# Patient Record
Sex: Male | Born: 1970 | Race: White | Hispanic: No | Marital: Single
Health system: Southern US, Community
[De-identification: ages and names within clinical notes are randomized; demographics above are authoritative.]

---

## 2019-06-10 ENCOUNTER — Other Ambulatory Visit: Payer: Self-pay

## 2019-06-10 ENCOUNTER — Emergency Department (HOSPITAL_COMMUNITY)
Admission: EM | Admit: 2019-06-10 | Discharge: 2019-06-11 | Disposition: A | Discharge status: Home / Self Care | Payer: BC Managed Care – PPO | Attending: Emergency Medicine | Admitting: Emergency Medicine

## 2019-06-10 DIAGNOSIS — Y939 Activity, unspecified: Secondary | ICD-10-CM | POA: Diagnosis not present

## 2019-06-10 DIAGNOSIS — S161XXA Strain of muscle, fascia and tendon at neck level, initial encounter: Secondary | ICD-10-CM | POA: Diagnosis not present

## 2019-06-10 DIAGNOSIS — Y929 Unspecified place or not applicable: Secondary | ICD-10-CM | POA: Diagnosis not present

## 2019-06-10 DIAGNOSIS — R519 Headache, unspecified: Secondary | ICD-10-CM | POA: Diagnosis present

## 2019-06-10 DIAGNOSIS — Y999 Unspecified external cause status: Secondary | ICD-10-CM | POA: Insufficient documentation

## 2019-06-10 DIAGNOSIS — S0990XA Unspecified injury of head, initial encounter: Secondary | ICD-10-CM | POA: Diagnosis not present
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
## 2019-06-11 ENCOUNTER — Emergency Department (HOSPITAL_COMMUNITY): Payer: BC Managed Care – PPO

## 2019-06-11 DIAGNOSIS — S161XXA Strain of muscle, fascia and tendon at neck level, initial encounter: Secondary | ICD-10-CM | POA: Diagnosis not present

## 2019-06-11 MED ORDER — CYCLOBENZAPRINE HCL 10 MG PO TABS: 10.0000 mg | ORAL_TABLET | Freq: Two times a day (BID) | ORAL | 0 refills | Status: AC | PRN

## 2019-06-11 MED ORDER — IBUPROFEN 800 MG PO TABS: 800.0000 mg | ORAL_TABLET | Freq: Three times a day (TID) | ORAL | 0 refills | Status: AC
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
## 2020-09-24 IMAGING — CT CT HEAD W/O CM
4 series · 17 of 47 positions shown, 19 images · non-contrast
Comparison: None.

CLINICAL DATA: Headache, head trauma

EXAM:
CT HEAD WITHOUT CONTRAST
TECHNIQUE: Contiguous axial images were obtained from the base of the skull
through the vertex without intravenous contrast.

[Series 3: head wo · axial · 0.56mm/px · z∈[+1004,+1134]mm · 7 of 36 slices shown, 9 images]
[im 5/36  brain]
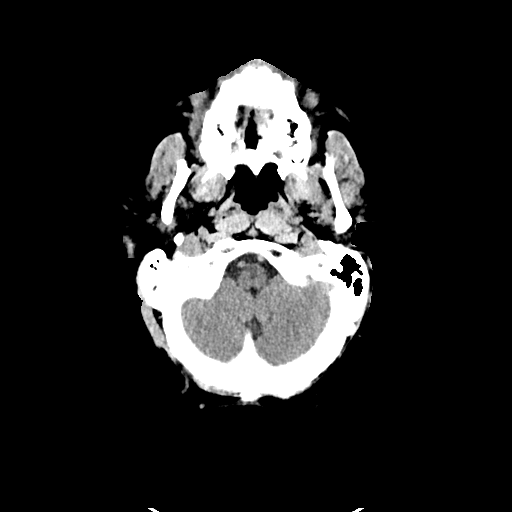
[im 5/36  bone]
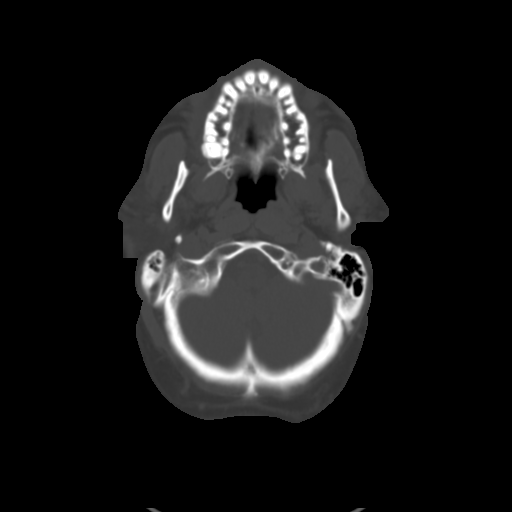
[im 9/36  brain]
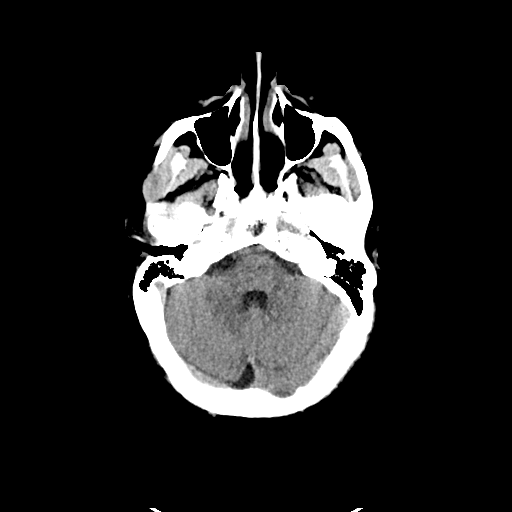
[im 14/36  brain]
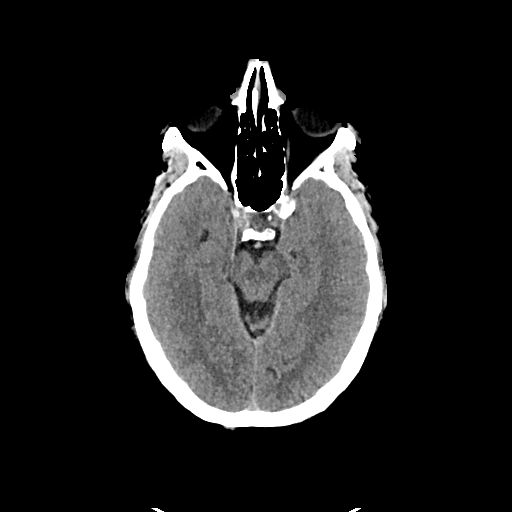
[im 18/36  brain]
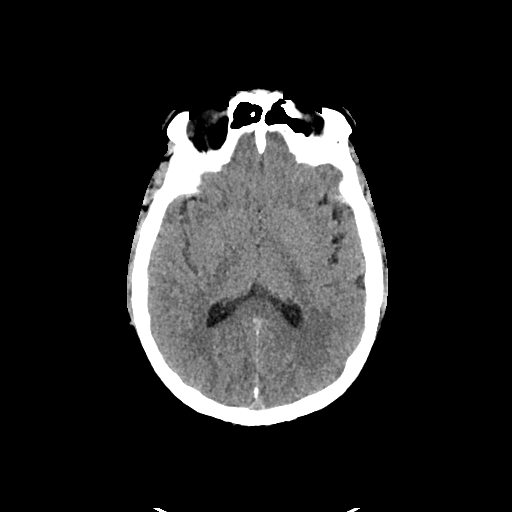
[im 22/36  brain]
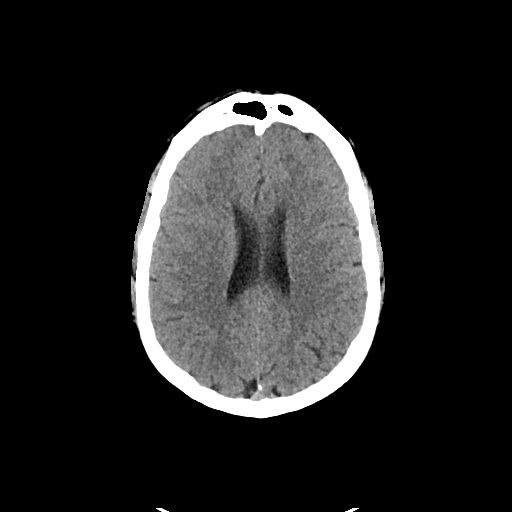
[im 22/36  bone]
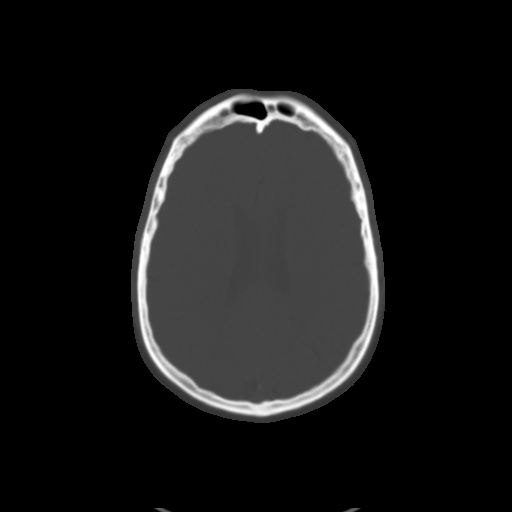
[im 27/36  brain]
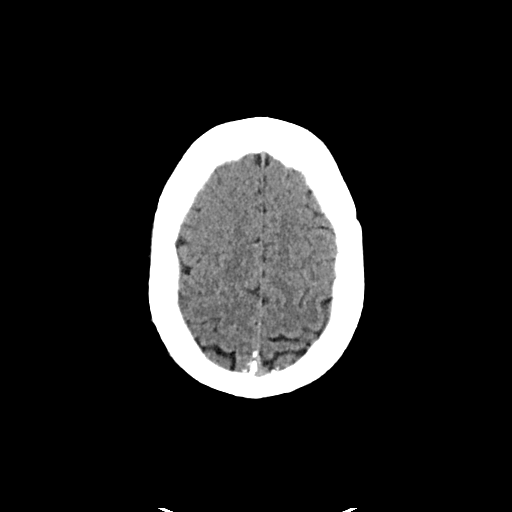
[im 31/36  brain]
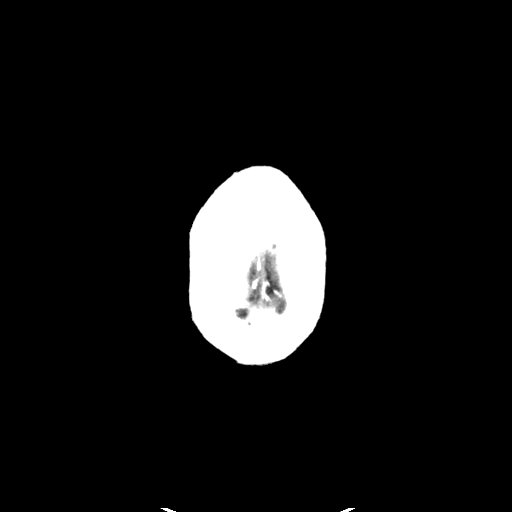

[Series 4: head bone · axial · 0.56mm/px · z∈[+1000,+1064]mm · 4 of 90 slices shown]
[im 9/90  bone]
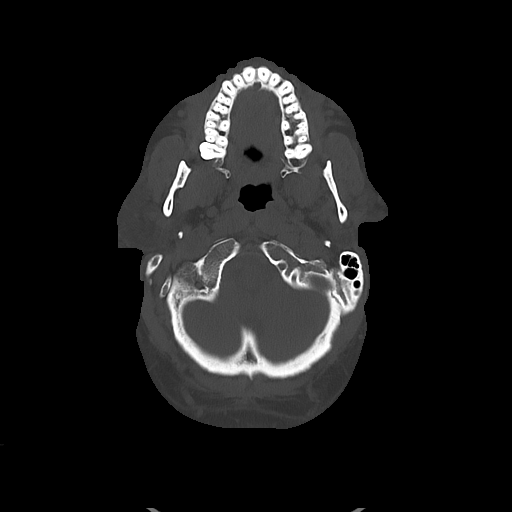
[im 18/90  bone]
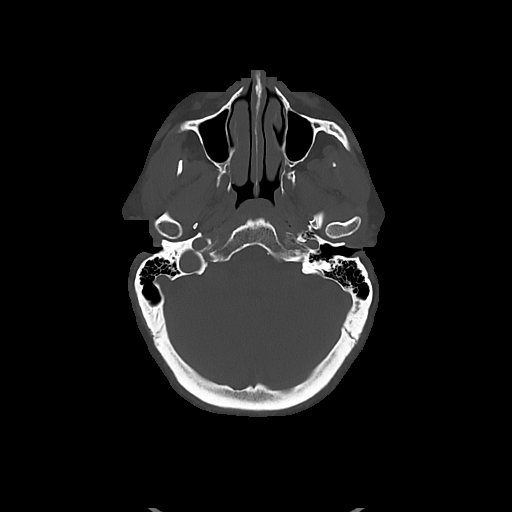
[im 27/90  bone]
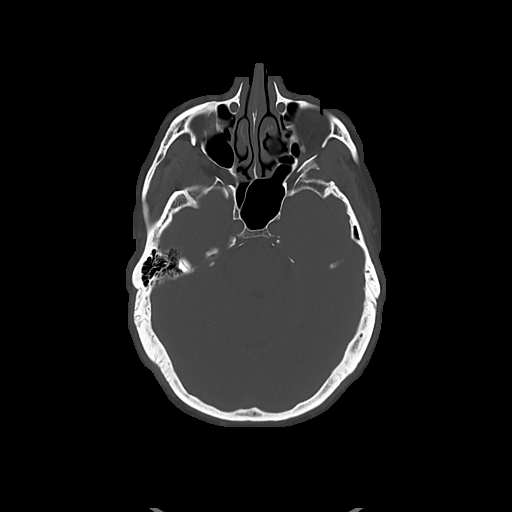
[im 41/90  bone]
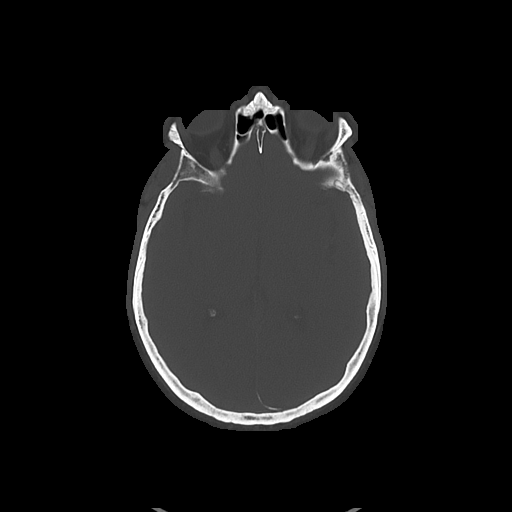

[Series 5: cor soft · coronal · 0.37mm/px · 3 of 81 slices shown]
[im 27/81  brain]
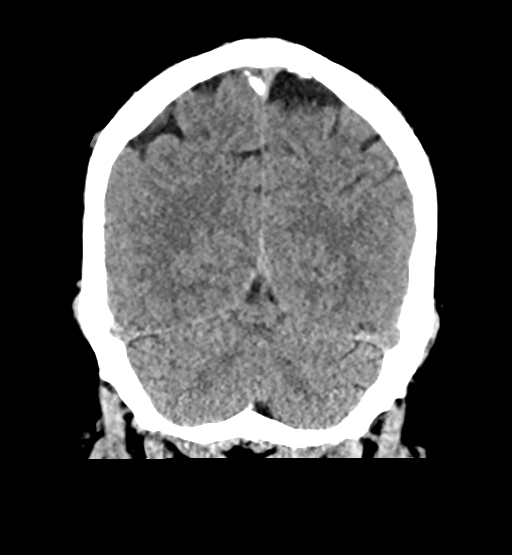
[im 36/81  brain]
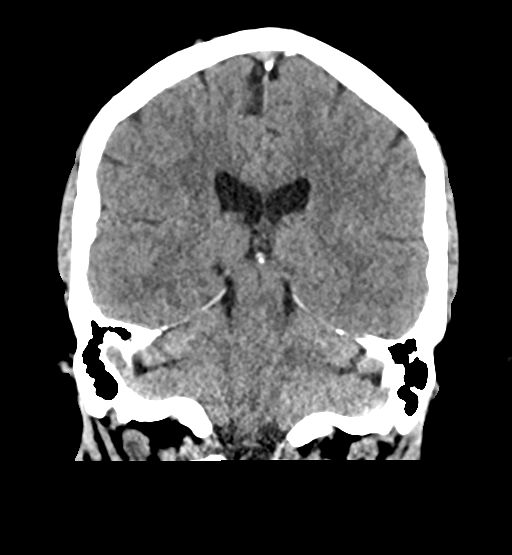
[im 45/81  brain]
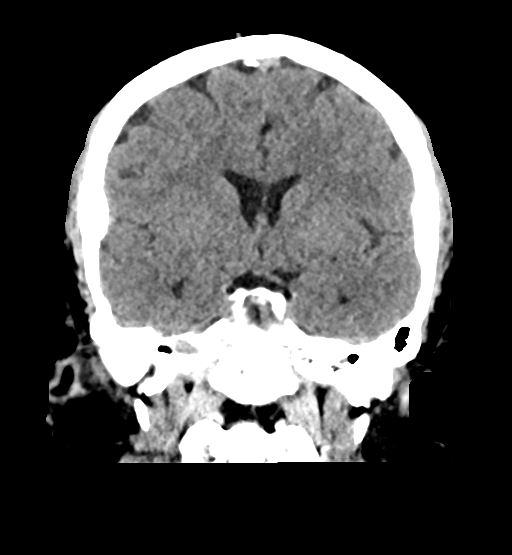

[Series 6: sag soft · sagittal · 0.40mm/px · 3 of 63 slices shown]
[im 21/63  brain]
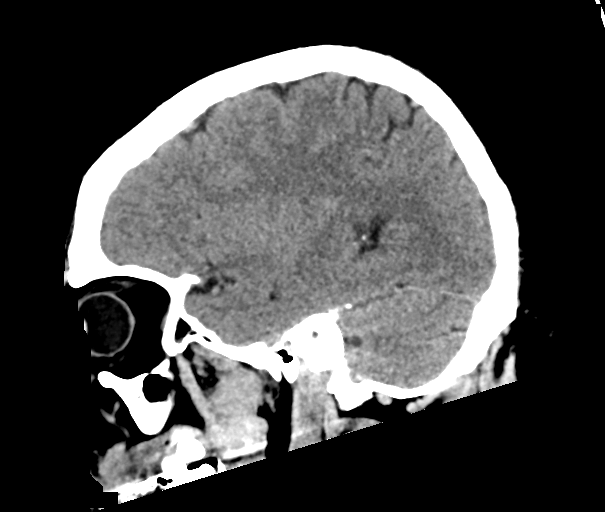
[im 32/63  brain]
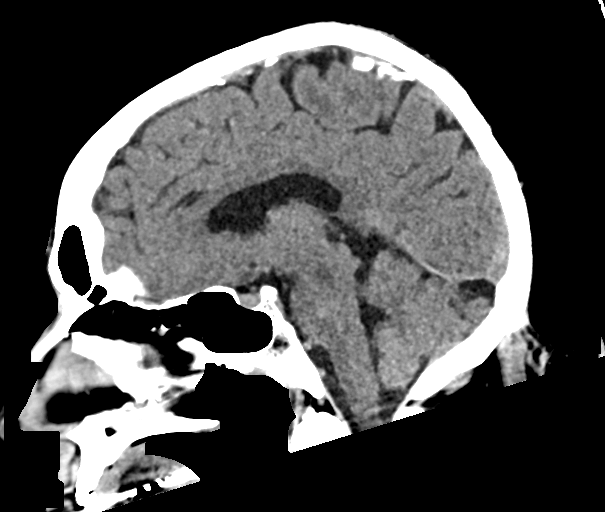
[im 42/63  brain]
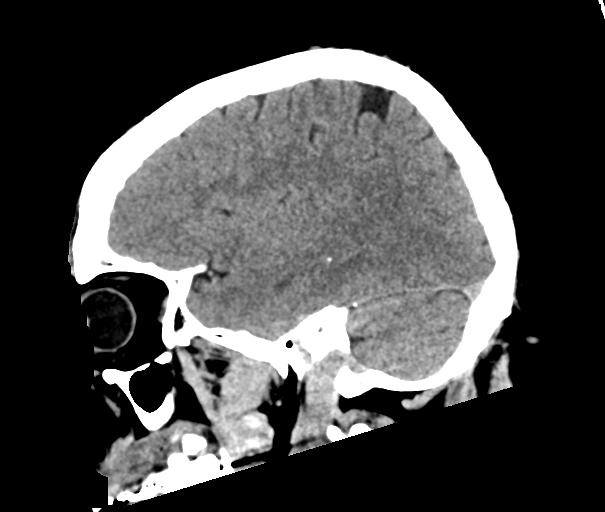

[17 of 47 positions shown; findings below may reference images not displayed]

FINDINGS: Brain: No acute territorial infarction, hemorrhage, or intracranial
mass. The ventricles are nonenlarged.

Vascular: No hyperdense vessels.  Scattered carotid calcification

Skull: Normal. Negative for fracture or focal lesion.

Sinuses/Orbits: No acute finding.

Other: None
IMPRESSION: Negative non contrasted CT appearance of the brain

## 2023-04-12 ENCOUNTER — Telehealth (INDEPENDENT_AMBULATORY_CARE_PROVIDER_SITE_OTHER): Payer: Self-pay

## 2023-04-12 NOTE — Telephone Encounter (Signed)
 Wrong patient
